# Patient Record
Sex: Female | Born: 2000 | Hispanic: No | Marital: Single
Health system: Southern US, Community
[De-identification: ages and names within clinical notes are randomized; demographics above are authoritative.]

## PROBLEM LIST (undated history)

## (undated) DIAGNOSIS — L709 Acne, unspecified: Secondary | ICD-10-CM

## (undated) HISTORY — DX: Acne, unspecified: L70.9

---

## 2018-09-06 ENCOUNTER — Encounter: Payer: Self-pay | Admitting: Podiatry

## 2018-09-06 ENCOUNTER — Other Ambulatory Visit: Payer: Self-pay

## 2018-09-06 ENCOUNTER — Ambulatory Visit (INDEPENDENT_AMBULATORY_CARE_PROVIDER_SITE_OTHER): Payer: BLUE CROSS/BLUE SHIELD | Admitting: Podiatry

## 2018-09-06 VITALS — BP 99/60 | HR 67 | Temp 97.3°F | Resp 16

## 2018-09-06 DIAGNOSIS — L600 Ingrowing nail: Secondary | ICD-10-CM

## 2018-09-06 MED ORDER — NEOMYCIN-POLYMYXIN-HC 1 % OT SOLN: OTIC | 1 refills | Status: AC

## 2018-09-06 NOTE — Progress Notes (Signed)
  Subjective:  Patient ID: Michelle Zhang, female    DOB: Dec 12, 2000,  MRN: [id number] HPI Chief Complaint  Patient presents with  . Toe Pain    Hallux left - medial border, tender, red, swollen x 1-2 weeks, no treatment - Active in [employer] and crossfit  . New Patient (Initial Visit)    18 y.o. female presents with the above complaint.   ROS: Denies fever chills nausea vomiting muscle aches pains calf pain back pain chest pain shortness of breath.  No past medical history on file.   Current Outpatient Medications:  .  NEOMYCIN-POLYMYXIN-HYDROCORTISONE (CORTISPORIN) 1 % SOLN OTIC solution, Apply 1-2 drops to toe BID after soaking, Disp: 10 mL, Rfl: 1  No Known Allergies Review of Systems Objective:   Vitals:   09/06/18 1045  BP: 99/60  Pulse: 67  Resp: 16  Temp: (!) 97.3 F (36.3 C)    General: Well developed, nourished, in no acute distress, alert and oriented x3   Dermatological: Skin is warm, dry and supple bilateral. Nails x 10 are well maintained; remaining integument appears unremarkable at this time. There are no open sores, no preulcerative lesions, no rash or signs of infection present.  Sharp incurvated nail margin tibial border hallux left  Vascular: Dorsalis Pedis artery and Posterior Tibial artery pedal pulses are 2/4 bilateral with immedate capillary fill time. Pedal hair growth present. No varicosities and no lower extremity edema present bilateral.   Neruologic: Grossly intact via light touch bilateral. Vibratory intact via tuning fork bilateral. Protective threshold with Semmes Wienstein monofilament intact to all pedal sites bilateral. Patellar and Achilles deep tendon reflexes 2+ bilateral. No Babinski or clonus noted bilateral.   Musculoskeletal: No gross boney pedal deformities bilateral. No pain, crepitus, or limitation noted with foot and ankle range of motion bilateral. Muscular strength 5/5 in all groups tested bilateral.  Gait: Unassisted,  Nonantalgic.    Radiographs:  None taken  Assessment & Plan:   Assessment: Ingrown toenail tibial border hallux left  Plan: Chemical matrixectomy tibial border hallux left was performed after local anesthesia was administered.  She tolerated the procedure well without complications.  She was given with a limited home-going structures for care and soaking of the foot.  She was provided with prescription for Cortisporin Otic to be applied twice daily after soaking.  Follow-up with her in 2 weeks.      T. Schurz, [place]

## 2018-09-06 NOTE — Patient Instructions (Signed)

## 2018-09-27 ENCOUNTER — Encounter: Payer: Self-pay | Admitting: Podiatry

## 2018-09-27 ENCOUNTER — Ambulatory Visit (INDEPENDENT_AMBULATORY_CARE_PROVIDER_SITE_OTHER): Payer: Self-pay | Admitting: Podiatry

## 2018-09-27 ENCOUNTER — Other Ambulatory Visit: Payer: Self-pay

## 2018-09-27 DIAGNOSIS — L600 Ingrowing nail: Secondary | ICD-10-CM

## 2018-09-27 NOTE — Progress Notes (Signed)
She presents today for follow-up of ingrown toenail hallux left.  States is doing great I have absolutely no problems it does not hurt at all.  Objective: Vital signs are stable alert and oriented x3 there is no erythema edema cellulitis drainage odor sharp nail margins have been resected I see no signs of infection.  No purulence.  Assessment: Well-healing surgical toe.  Plan: Continue to soak every other day Epson salts and warm water cover only during the day leave open at nighttime continue Corticosporin otic daily.

## 2019-04-23 ENCOUNTER — Ambulatory Visit: Payer: Self-pay | Attending: Internal Medicine

## 2019-04-23 DIAGNOSIS — Z23 Encounter for immunization: Secondary | ICD-10-CM

## 2019-04-23 NOTE — Progress Notes (Signed)
   Covid-19 Vaccination Clinic  Name:  Donalyn Schneeberger    MRN: [id number] DOB: 2000/10/31  04/23/2019  Ms. Bookbinder was observed post Covid-19 immunization for 15 minutes without incident. She was provided with Vaccine Information Sheet and instruction to access the V-Safe system.   Ms. Gardenhire was instructed to call 911 with any severe reactions post vaccine: Marland Kitchen Difficulty breathing  . Swelling of face and throat  . A fast heartbeat  . A bad rash all over body  . Dizziness and weakness   Immunizations Administered    Name Date Dose VIS Date Route   Pfizer COVID-19 Vaccine 04/23/2019  2:55 PM 0.3 mL 01/12/2019 Intramuscular   Manufacturer: [employer], [employer]   Lot: [id number]   NDC: [address]-9

## 2019-05-14 ENCOUNTER — Ambulatory Visit: Payer: Self-pay | Attending: Internal Medicine

## 2019-05-14 DIAGNOSIS — Z23 Encounter for immunization: Secondary | ICD-10-CM

## 2019-05-14 NOTE — Progress Notes (Signed)
   Covid-19 Vaccination Clinic  Name:  Michelle Zhang    MRN: [id number] DOB: 2000-11-11  05/14/2019  Ms. Kavanagh was observed post Covid-19 immunization for 15 minutes without incident. She was provided with Vaccine Information Sheet and instruction to access the V-Safe system.   Ms. Takacs was instructed to call 911 with any severe reactions post vaccine: Marland Kitchen Difficulty breathing  . Swelling of face and throat  . A fast heartbeat  . A bad rash all over body  . Dizziness and weakness   Immunizations Administered    Name Date Dose VIS Date Route   Pfizer COVID-19 Vaccine 05/14/2019  2:34 PM 0.3 mL 01/12/2019 Intramuscular   Manufacturer: [employer], [employer]   Lot: [id number]   NDC: [address]-3

## 2019-11-28 ENCOUNTER — Emergency Department
Admission: EM | Admit: 2019-11-28 | Discharge: 2019-11-28 | Disposition: A | Discharge status: Home / Self Care | Payer: BC Managed Care – PPO | Attending: Emergency Medicine | Admitting: Emergency Medicine

## 2019-11-28 ENCOUNTER — Emergency Department: Payer: BC Managed Care – PPO

## 2019-11-28 ENCOUNTER — Other Ambulatory Visit: Payer: Self-pay

## 2019-11-28 DIAGNOSIS — W010XXA Fall on same level from slipping, tripping and stumbling without subsequent striking against object, initial encounter: Secondary | ICD-10-CM | POA: Insufficient documentation

## 2019-11-28 DIAGNOSIS — Y93A1 Activity, exercise machines primarily for cardiorespiratory conditioning: Secondary | ICD-10-CM | POA: Diagnosis not present

## 2019-11-28 DIAGNOSIS — S0990XA Unspecified injury of head, initial encounter: Secondary | ICD-10-CM | POA: Diagnosis present

## 2019-11-28 DIAGNOSIS — M545 Low back pain, unspecified: Secondary | ICD-10-CM | POA: Insufficient documentation

## 2019-11-28 DIAGNOSIS — W19XXXA Unspecified fall, initial encounter: Secondary | ICD-10-CM

## 2019-11-28 LAB — POC URINE PREG, ED: Preg Test, Ur: NEGATIVE

## 2019-11-28 MED ORDER — MELOXICAM 15 MG PO TABS: 15.0000 mg | ORAL_TABLET | Freq: Every day | ORAL | 1 refills | Status: AC

## 2019-11-28 NOTE — Discharge Instructions (Signed)
Take meloxicam once daily for pain and inflammation. 

## 2019-11-28 NOTE — ED Provider Notes (Signed)
Emergency Department Provider Note  ____________________________________________  Time seen: Approximately 9:53 PM  I have reviewed the triage vital signs and the nursing notes.   HISTORY  Chief Complaint Head Injury and Back Pain   Historian Patient     HPI Michelle Zhang is a 19 y.o. female presents to the emergency department after patient experienced a fall.  Patient was working on a piece of gym equipment when she lost her balance and fell backwards, hitting the back of her head.  She states that she experienced some low back pain but was able to stand up and continue working out.  She states that she went for a run and felt like her headache and low back pain were worsening.  She denies loss of consciousness at the time of injury.  No numbness or tingling in the upper and lower extremities.  No abrasions or lacerations.  No other alleviating measures have been attempted.   No past medical history on file.   Immunizations up to date:  Yes.     No past medical history on file.  There are no problems to display for this patient.   No past surgical history on file.  Prior to Admission medications   Medication Sig Start Date End Date Taking? Authorizing Provider  meloxicam (MOBIC) 15 MG tablet Take 1 tablet (15 mg total) by mouth daily for 7 days. 11/28/19 12/05/19  Orvil Feil, PA-C  NEOMYCIN-POLYMYXIN-HYDROCORTISONE (CORTISPORIN) 1 % SOLN OTIC solution Apply 1-2 drops to toe BID after soaking 09/06/18   Hyatt, Max T, DPM    Allergies Patient has no known allergies.  No family history on file.  Social History Social History   Tobacco Use   Smoking status: Never Smoker   Smokeless tobacco: Never Used  Substance Use Topics   Alcohol use: Not Currently   Drug use: Not on file     Review of Systems  Constitutional: No fever/chills Eyes:  No discharge ENT: No upper respiratory complaints. Respiratory: no cough. No SOB/ use of accessory muscles to  breath Gastrointestinal:   No nausea, no vomiting.  No diarrhea.  No constipation. Musculoskeletal: Negative for musculoskeletal pain. Skin: Negative for rash, abrasions, lacerations, ecchymosis.   ____________________________________________   PHYSICAL EXAM:  VITAL SIGNS: ED Triage Vitals  Enc Vitals Group     BP 11/28/19 1836 (!) 143/91     Pulse Rate 11/28/19 1836 81     Resp 11/28/19 1836 18     Temp 11/28/19 1836 98.9 F (37.2 C)     Temp Source 11/28/19 1836 Oral     SpO2 11/28/19 1836 100 %     Weight 11/28/19 1837 163 lb (73.9 kg)     Height 11/28/19 1837 5\' 7"  (1.702 m)     Head Circumference --      Peak Flow --      Pain Score 11/28/19 1836 5     Pain Loc --      Pain Edu? --      Excl. in GC? --      Constitutional: Alert and oriented. Well appearing and in no acute distress. Eyes: Conjunctivae are normal. PERRL. EOMI. Head: Atraumatic. ENT:      Nose: No congestion/rhinnorhea.      Mouth/Throat: Mucous membranes are moist.  Neck: No stridor.  Full range of motion.  No midline C-spine tenderness to palpation. Cardiovascular: Normal rate, regular rhythm. Normal S1 and S2.  Good peripheral circulation. Respiratory: Normal respiratory effort without tachypnea or  retractions. Lungs CTAB. Good air entry to the bases with no decreased or absent breath sounds Gastrointestinal: Bowel sounds x 4 quadrants. Soft and nontender to palpation. No guarding or rigidity. No distention. Musculoskeletal: Full range of motion to all extremities. No obvious deformities noted Neurologic:  Normal for age. No gross focal neurologic deficits are appreciated.  Skin:  Skin is warm, dry and intact. No rash noted. Psychiatric: Mood and affect are normal for age. Speech and behavior are normal.   ____________________________________________   LABS (all labs ordered are listed, but only abnormal results are displayed)  Labs Reviewed  POC URINE PREG, ED    ____________________________________________  EKG   ____________________________________________  RADIOLOGY Geraldo Pitter, personally viewed and evaluated these images (plain radiographs) as part of my medical decision making, as well as reviewing the written report by the radiologist.  DG Lumbar Spine 2-3 Views  Result Date: 11/28/2019 CLINICAL DATA:  Fall EXAM: LUMBAR SPINE - 2-3 VIEW COMPARISON:  None. FINDINGS: There is no evidence of lumbar spine fracture. Alignment is normal. Intervertebral disc spaces are maintained. IMPRESSION: Negative. Electronically Signed   By: Guadlupe Spanish M.D.   On: 11/28/2019 21:00   CT Head Wo Contrast  Result Date: 11/28/2019 CLINICAL DATA:  19 year old female with head trauma. EXAM: CT HEAD WITHOUT CONTRAST CT CERVICAL SPINE WITHOUT CONTRAST TECHNIQUE: Multidetector CT imaging of the head and cervical spine was performed following the standard protocol without intravenous contrast. Multiplanar CT image reconstructions of the cervical spine were also generated. COMPARISON:  None. FINDINGS: CT HEAD FINDINGS Brain: No evidence of acute infarction, hemorrhage, hydrocephalus, extra-axial collection or mass lesion/mass effect. Vascular: No hyperdense vessel or unexpected calcification. Skull: Normal. Negative for fracture or focal lesion. Sinuses/Orbits: No acute finding. Other: Small right parietal scalp contusion. CT CERVICAL SPINE FINDINGS Alignment: No acute subluxation. There is straightening of normal cervical lordosis which may be positional or due to muscle spasm. Skull base and vertebrae: No acute fracture. Soft tissues and spinal canal: No prevertebral fluid or swelling. No visible canal hematoma. Disc levels:  No acute findings. No degenerative changes. Upper chest: Negative. Other: None IMPRESSION: 1. Normal unenhanced CT of the brain. 2. No acute/traumatic cervical spine pathology. Electronically Signed   By: Elgie Collard M.D.   On: 11/28/2019  21:18   CT Cervical Spine Wo Contrast  Result Date: 11/28/2019 CLINICAL DATA:  19 year old female with head trauma. EXAM: CT HEAD WITHOUT CONTRAST CT CERVICAL SPINE WITHOUT CONTRAST TECHNIQUE: Multidetector CT imaging of the head and cervical spine was performed following the standard protocol without intravenous contrast. Multiplanar CT image reconstructions of the cervical spine were also generated. COMPARISON:  None. FINDINGS: CT HEAD FINDINGS Brain: No evidence of acute infarction, hemorrhage, hydrocephalus, extra-axial collection or mass lesion/mass effect. Vascular: No hyperdense vessel or unexpected calcification. Skull: Normal. Negative for fracture or focal lesion. Sinuses/Orbits: No acute finding. Other: Small right parietal scalp contusion. CT CERVICAL SPINE FINDINGS Alignment: No acute subluxation. There is straightening of normal cervical lordosis which may be positional or due to muscle spasm. Skull base and vertebrae: No acute fracture. Soft tissues and spinal canal: No prevertebral fluid or swelling. No visible canal hematoma. Disc levels:  No acute findings. No degenerative changes. Upper chest: Negative. Other: None IMPRESSION: 1. Normal unenhanced CT of the brain. 2. No acute/traumatic cervical spine pathology. Electronically Signed   By: Elgie Collard M.D.   On: 11/28/2019 21:18    ____________________________________________    PROCEDURES  Procedure(s) performed:  Procedures     Medications - No data to display   ____________________________________________   INITIAL IMPRESSION / ASSESSMENT AND PLAN / ED COURSE  Pertinent labs & imaging results that were available during my care of the patient were reviewed by me and considered in my medical decision making (see chart for details).      Assessment and plan Fall 19 year old female presents to the emergency department after patient had a fall while working out.  Vital signs were reassuring at triage.   No neuro deficits were noted on exam.  CT head and CT cervical spine revealed no evidence of intracranial bleed, skull fracture or C-spine fracture.  X-ray of the lumbar spine reveals no bony abnormality.  Patient was discharged with meloxicam.  Return precautions were given to return with new or worsening symptoms.    ____________________________________________  FINAL CLINICAL IMPRESSION(S) / ED DIAGNOSES  Final diagnoses:  Fall, initial encounter      NEW MEDICATIONS STARTED DURING THIS VISIT:  ED Discharge Orders         Ordered    meloxicam (MOBIC) 15 MG tablet  Daily        11/28/19 2151              This chart was dictated using voice recognition software/Dragon. Despite best efforts to proofread, errors can occur which can change the meaning. Any change was purely unintentional.     Gasper Lloyd 11/28/19 2156    Chesley Noon, MD 11/30/19 [phone]

## 2019-11-28 NOTE — ED Triage Notes (Signed)
Pt to ED via POV c/o head injury and back pain from fall while working out this afternoon. States she hit her head on a metal bar and fell onto back on concrete.  Denies LOC.  Clear speech, NAD noted. Ambulatory

## 2019-11-28 NOTE — ED Notes (Addendum)
Pt reports at approx 1650 today she fell off a calisthenics bar and hit her head, also injuring her lower back. Pt reports pain to back of head and right side of lumbar. Pt denies LOC. Pt reports headache, mild light sensitivity, which she took 2 ibuprofen and 500 mg tylenol with improvement. Pt denies vision changes, n/v. Pt denies history of previous head or back injury   Pt reports afterwards she went on a 2 mi run, which she felt worse after.   Pt alert and oriented x4. States she feels somewhat dazed and tired

## 2019-11-28 NOTE — ED Notes (Signed)
poct pregnancy Negative 

## 2020-02-05 ENCOUNTER — Encounter: Payer: Self-pay | Admitting: Obstetrics and Gynecology

## 2020-02-05 ENCOUNTER — Other Ambulatory Visit: Payer: Self-pay

## 2020-02-05 ENCOUNTER — Other Ambulatory Visit (HOSPITAL_COMMUNITY)
Admission: RE | Admit: 2020-02-05 | Discharge: 2020-02-05 | Disposition: A | Discharge status: Home / Self Care | Payer: BC Managed Care – PPO | Source: Ambulatory Visit | Attending: Obstetrics and Gynecology | Admitting: Obstetrics and Gynecology

## 2020-02-05 ENCOUNTER — Ambulatory Visit (INDEPENDENT_AMBULATORY_CARE_PROVIDER_SITE_OTHER): Payer: BC Managed Care – PPO | Admitting: Obstetrics and Gynecology

## 2020-02-05 VITALS — BP 114/66 | HR 85 | Wt 169.8 lb

## 2020-02-05 DIAGNOSIS — Z113 Encounter for screening for infections with a predominantly sexual mode of transmission: Secondary | ICD-10-CM | POA: Insufficient documentation

## 2020-02-05 DIAGNOSIS — Z30011 Encounter for initial prescription of contraceptive pills: Secondary | ICD-10-CM

## 2020-02-05 DIAGNOSIS — Z30432 Encounter for removal of intrauterine contraceptive device: Secondary | ICD-10-CM

## 2020-02-05 MED ORDER — NORETHIN ACE-ETH ESTRAD-FE 1-20 MG-MCG(24) PO TABS: 1.0000 | ORAL_TABLET | Freq: Every day | ORAL | 3 refills | Status: AC

## 2020-02-05 NOTE — Patient Instructions (Signed)
I value your feedback and entrusting us with your care. If you get a [facility] patient survey, I would appreciate you taking the time to let us know about your experience today. Thank you!  As of January 11, 2019, your lab results will be released to your MyChart immediately, before I even have a chance to see them. Please give me time to review them and contact you if there are any abnormalities. Thank you for your patience.  

## 2020-02-05 NOTE — Progress Notes (Signed)
PCP:  System, Provider Not In   Chief Complaint  Patient presents with  . Contraception    IUD removal     HPI:      Ms. Michelle Zhang is a 20 y.o. No obstetric history on file. whose LMP was Patient's last menstrual period was 12/25/2019 (exact date)., presents today for her NP IUD removal. Mirena placed about 4 yrs ago. Having increased acne and thinks it's related to IUD. On spironolactone for sx without improvement.  Pt is amenorrheic with IUD, with occas BTB, no dysmen.   Sex activity: single partner, contraception - IUD. Unsure what type of BC to try after IUD removal. Did OCPs in past but hard to take on time during adolescence. Also did depo with wt gain. No hx of HTN, DVTs, migraines with aura.  Last Pap: 11/21 with home GYN in [place]; STD testing not done since pt not sex active then. Hx of STDs: none   Past Medical History:  Diagnosis Date  . Acne     History reviewed. No pertinent surgical history.  Family History  Problem Relation Age of Onset  . Breast cancer Neg Hx   . Ovarian cancer Neg Hx     Social History   Socioeconomic History  . Marital status: Single    Spouse name: Not on file  . Number of children: Not on file  . Years of education: Not on file  . Highest education level: Not on file  Occupational History  . Not on file  Tobacco Use  . Smoking status: Never Smoker  . Smokeless tobacco: Never Used  Substance and Sexual Activity  . Alcohol use: Not Currently  . Drug use: Not on file  . Sexual activity: Not on file  Other Topics Concern  . Not on file  Social History Narrative  . Not on file   Social Determinants of Health   Financial Resource Strain: Not on file  Food Insecurity: Not on file  Transportation Needs: Not on file  Physical Activity: Not on file  Stress: Not on file  Social Connections: Not on file  Intimate Partner Violence: Not on file     Current Outpatient Medications:  .  clindamycin (CLINDAGEL) 1 % gel,  APPLY A THIN LAYER TO THE AFFECTED AREA(S) BY TOPICAL ROUTE 2 TIMES PER DAY, Disp: , Rfl:  .  Norethindrone Acetate-Ethinyl Estrad-FE (LOESTRIN 24 FE) 1-20 MG-MCG(24) tablet, Take 1 tablet by mouth daily., Disp: 84 tablet, Rfl: 3 .  spironolactone (ALDACTONE) 25 MG tablet, Take 25 mg by mouth daily., Disp: , Rfl:  .  NEOMYCIN-POLYMYXIN-HYDROCORTISONE (CORTISPORIN) 1 % SOLN OTIC solution, Apply 1-2 drops to toe BID after soaking (Patient not taking: Reported on 02/05/2020), Disp: 10 mL, Rfl: 1     ROS:  Review of Systems  Constitutional: Negative for fever, malaise/fatigue and weight loss.  Gastrointestinal: Negative for blood in stool, constipation, diarrhea, nausea and vomiting.  Genitourinary: Negative for dyspareunia, dysuria, flank pain, frequency, hematuria, urgency, vaginal bleeding, vaginal discharge and vaginal pain.  Musculoskeletal: Negative for back pain.  Skin: Negative for itching and rash.    Objective: BP 114/66   Pulse 85   Wt 169 lb 12.8 oz (77 kg)   LMP 12/25/2019 (Exact Date)   BMI 26.59 kg/m    Physical Exam Constitutional:      General: She is not in acute distress. Genitourinary:     Vulva, vagina, uterus, right adnexa and left adnexa normal.     Right Labia:  No tenderness or lesions.    Left Labia: No tenderness or lesions.    No labial lesion, tenderness or ulcerations noted.     No vaginal discharge, erythema, tenderness or bleeding.      Right Adnexa: not tender and no mass present.    Left Adnexa: not tender and no mass present.    No cervical friability or polyp.     IUD strings visualized.     Uterus is not enlarged or tender.  Pulmonary:     Effort: Pulmonary effort is normal.  Musculoskeletal:        General: Normal range of motion.  Neurological:     General: No focal deficit present.     Mental Status: She is alert.     Cranial Nerves: No cranial nerve deficit.  Skin:    General: Skin is warm and dry.  Psychiatric:        Mood and  Affect: Mood normal.        Behavior: Behavior normal.        Thought Content: Thought content normal.        Judgment: Judgment normal.  Vitals and nursing note reviewed.    IUD Removal Strings of IUD identified and grasped.  IUD removed without problem with ring forceps.  Pt tolerated this well.  IUD noted to be intact.  Assessment:   Encounter for initial prescription of contraceptive pills - Plan: Norethindrone Acetate-Ethinyl Estrad-FE (LOESTRIN 24 FE) 1-20 MG-MCG(24) tablet; BC options discussed. Pt would like to try OCPs. Rx eRxd. Start today, condoms for 7 days. F/u prn. See if improves acne.   Screening for STD (sexually transmitted disease) - Plan: Cervicovaginal ancillary only,   Encounter for IUD removal--pt tolerated well.    Meds ordered this encounter  Medications  . Norethindrone Acetate-Ethinyl Estrad-FE (LOESTRIN 24 FE) 1-20 MG-MCG(24) tablet    Sig: Take 1 tablet by mouth daily.    Dispense:  84 tablet    Refill:  3    Order Specific Question:   Supervising Provider    Answer:   Nadara Mustard [[id number]]             GYN counsel use and side effects of OCP's     F/U  Return if symptoms worsen or fail to improve.  Draedyn Weidinger B. Dalaysia Harms, PA-C 02/05/2020 4:28 PM

## 2020-02-07 LAB — CERVICOVAGINAL ANCILLARY ONLY
Chlamydia: NEGATIVE
Comment: NEGATIVE
Comment: NORMAL
Neisseria Gonorrhea: NEGATIVE

## 2020-03-05 ENCOUNTER — Encounter: Payer: Self-pay | Admitting: Obstetrics and Gynecology

## 2022-01-08 IMAGING — CT CT HEAD W/O CM
3 series · 15 of 47 positions shown, 18 images · non-contrast
Comparison: None.

CLINICAL DATA: 19-year-old female with head trauma.

EXAM:
CT HEAD WITHOUT CONTRAST
CT CERVICAL SPINE WITHOUT CONTRAST
TECHNIQUE: Multidetector CT imaging of the head and cervical spine was
performed following the standard protocol without intravenous
contrast. Multiplanar CT image reconstructions of the cervical spine
were also generated.

[Series 3: head wo · axial · 0.45mm/px · z∈[-129,-4]mm · 9 of 30 slices shown, 12 images]
[im 3/30  brain]
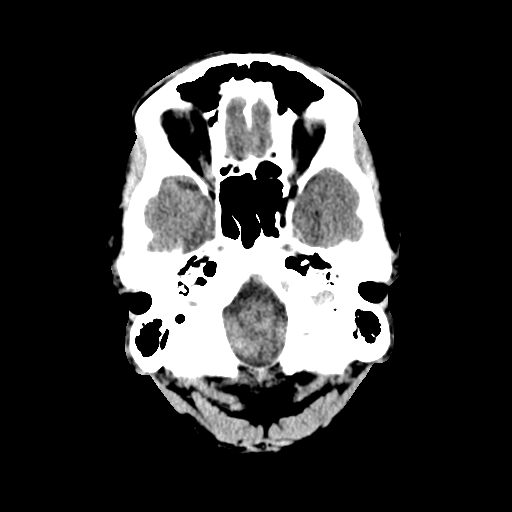
[im 3/30  bone]
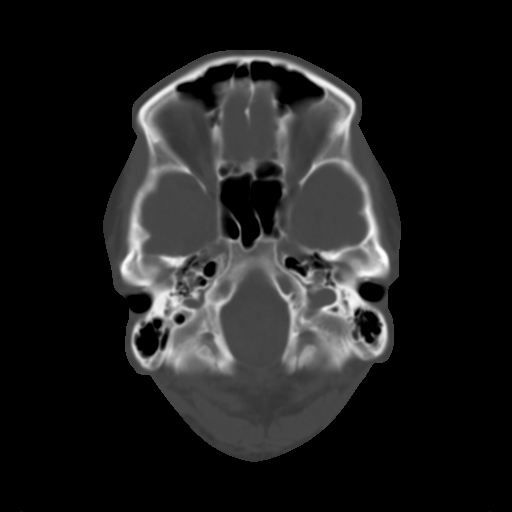
[im 6/30  brain]
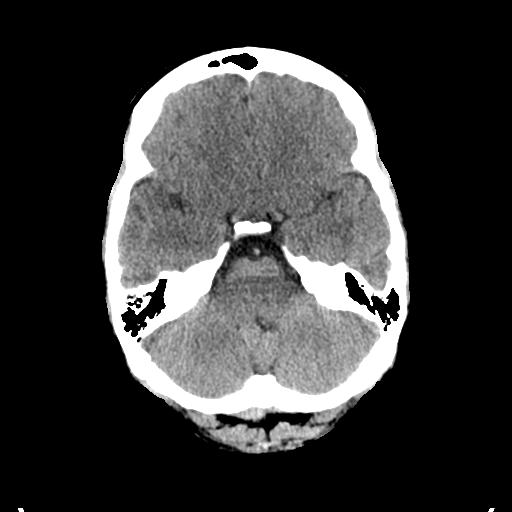
[im 9/30  brain]
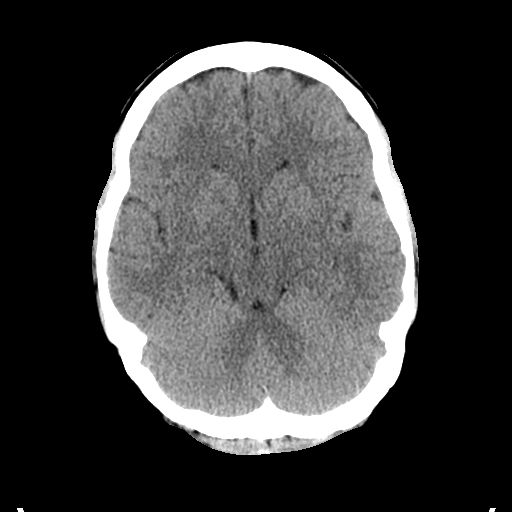
[im 12/30  brain]
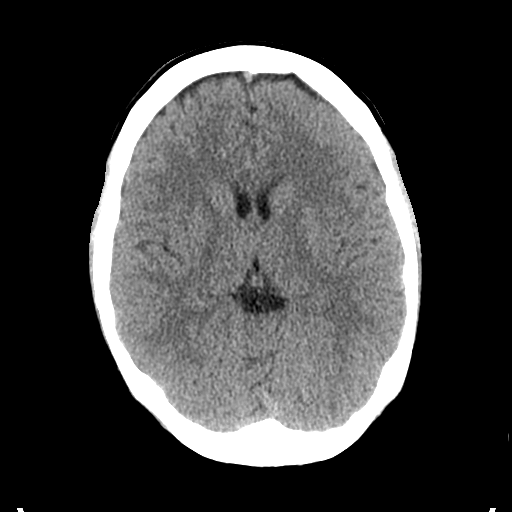
[im 16/30  brain]
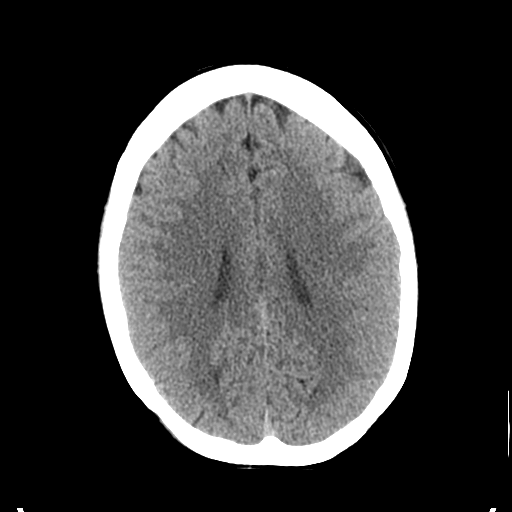
[im 16/30  bone]
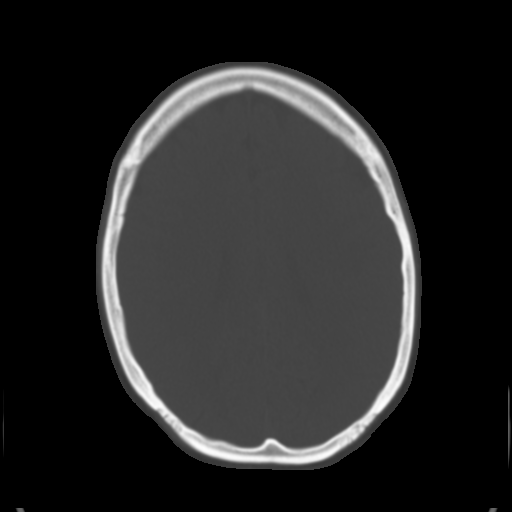
[im 19/30  brain]
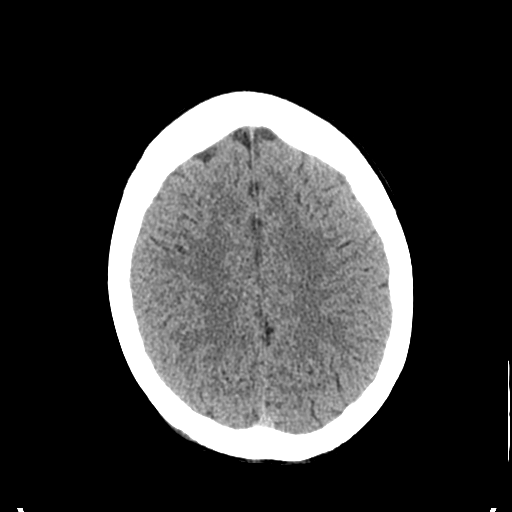
[im 22/30  brain]
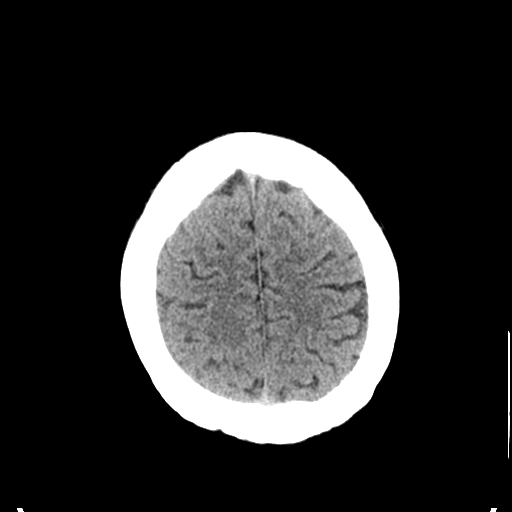
[im 25/30  brain]
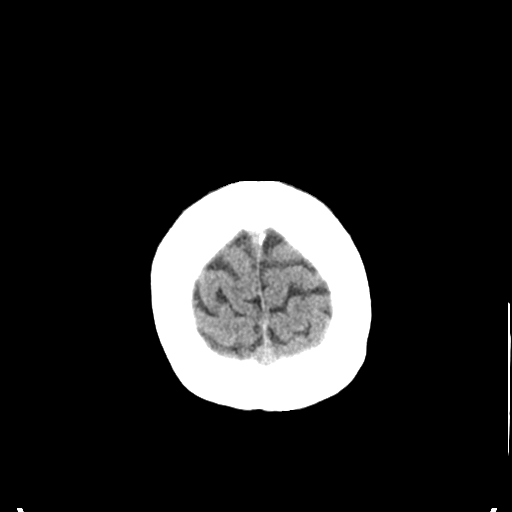
[im 28/30  brain]
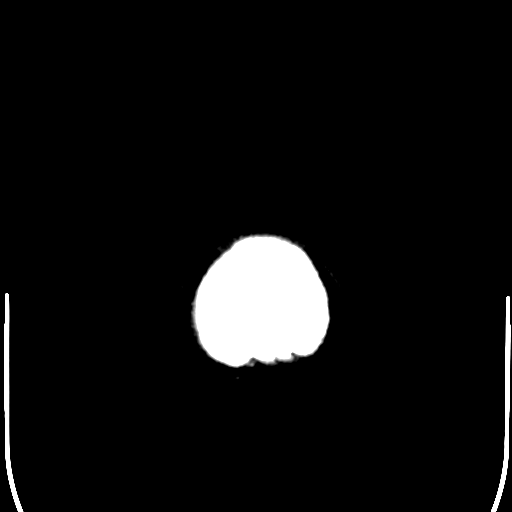
[im 28/30  bone]
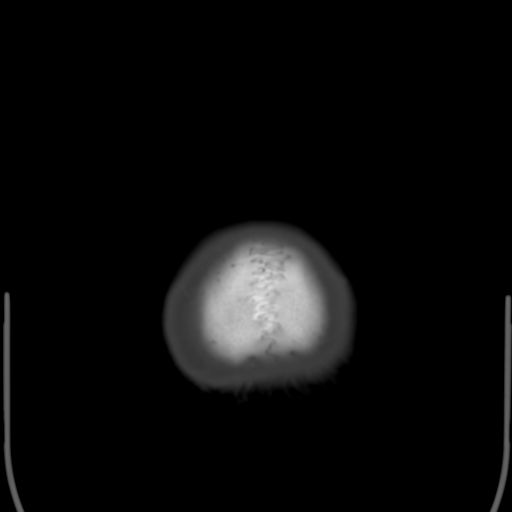

[Series 4: coronal soft tissue · coronal · 0.35mm/px · 3 of 70 slices shown]
[im 24/70  brain]
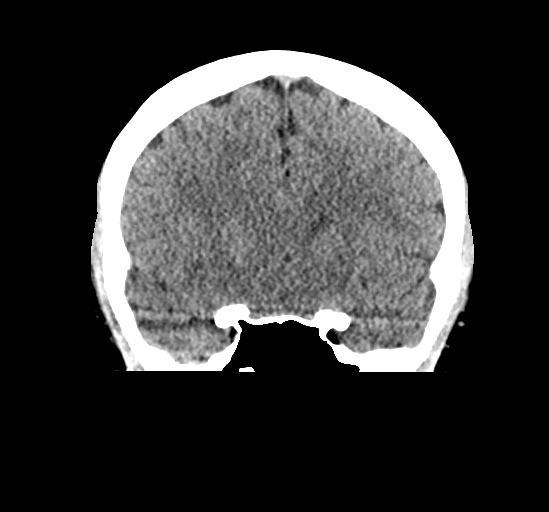
[im 31/70  brain]
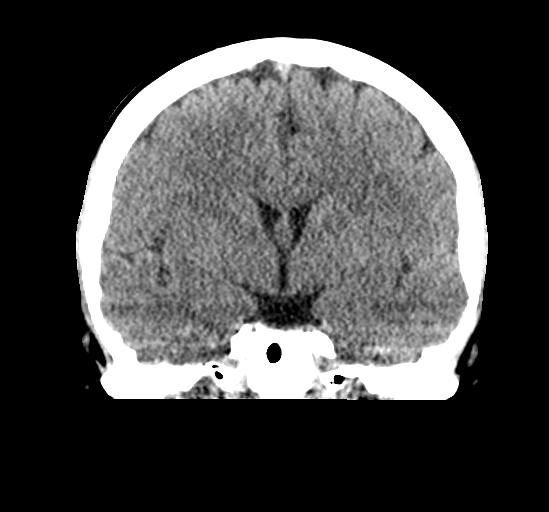
[im 39/70  brain]
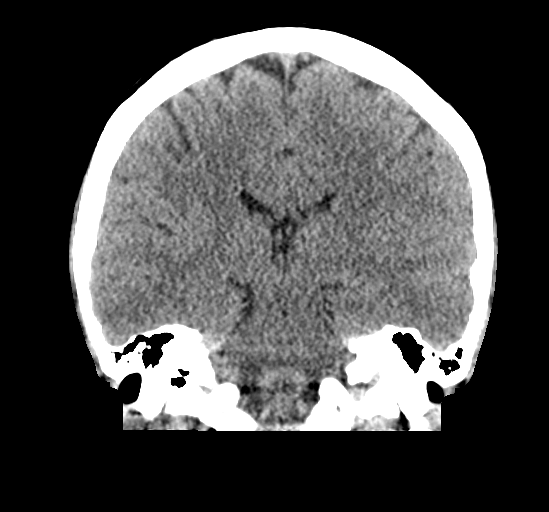

[Series 5: sagittal soft tissue · sagittal · 0.35mm/px · 3 of 55 slices shown]
[im 19/55  brain]
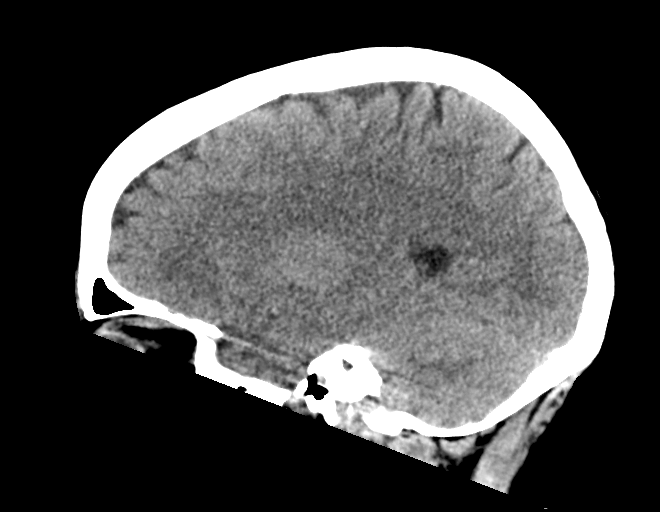
[im 28/55  brain]
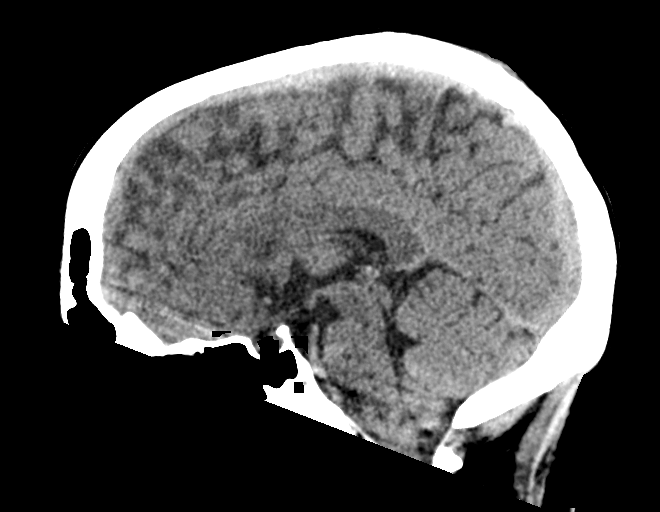
[im 37/55  brain]
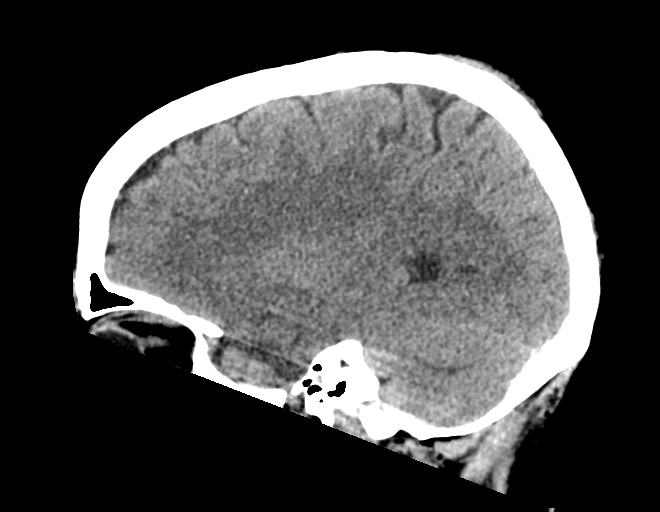

[15 of 47 positions shown; findings below may reference images not displayed]

FINDINGS: CT HEAD FINDINGS

Brain: No evidence of acute infarction, hemorrhage, hydrocephalus,
extra-axial collection or mass lesion/mass effect.

Vascular: No hyperdense vessel or unexpected calcification.

Skull: Normal. Negative for fracture or focal lesion.

Sinuses/Orbits: No acute finding.

Other: Small right parietal scalp contusion.

CT CERVICAL SPINE FINDINGS

Alignment: No acute subluxation. There is straightening of normal
cervical lordosis which may be positional or due to muscle spasm.

Skull base and vertebrae: No acute fracture.

Soft tissues and spinal canal: No prevertebral fluid or swelling. No
visible canal hematoma.

Disc levels:  No acute findings. No degenerative changes.

Upper chest: Negative.

Other: None
IMPRESSION: 1. Normal unenhanced CT of the brain.
2. No acute/traumatic cervical spine pathology.
